# Patient Record
Sex: Female | Born: 1965
Health system: Southern US, Community
[De-identification: ages and names within clinical notes are randomized; demographics above are authoritative.]

## PROBLEM LIST (undated history)

## (undated) HISTORY — PX: ABDOMINAL HYSTERECTOMY: SHX81

---

## 2016-01-10 ENCOUNTER — Emergency Department (HOSPITAL_BASED_OUTPATIENT_CLINIC_OR_DEPARTMENT_OTHER)
Admission: EM | Admit: 2016-01-10 | Discharge: 2016-01-10 | Disposition: A | Discharge status: Home / Self Care | Payer: No Typology Code available for payment source | Attending: Emergency Medicine | Admitting: Emergency Medicine

## 2016-01-10 ENCOUNTER — Encounter (HOSPITAL_BASED_OUTPATIENT_CLINIC_OR_DEPARTMENT_OTHER): Payer: Self-pay | Admitting: Emergency Medicine

## 2016-01-10 DIAGNOSIS — Y9389 Activity, other specified: Secondary | ICD-10-CM | POA: Insufficient documentation

## 2016-01-10 DIAGNOSIS — M542 Cervicalgia: Secondary | ICD-10-CM | POA: Diagnosis present

## 2016-01-10 DIAGNOSIS — Y999 Unspecified external cause status: Secondary | ICD-10-CM | POA: Diagnosis not present

## 2016-01-10 DIAGNOSIS — Y92410 Unspecified street and highway as the place of occurrence of the external cause: Secondary | ICD-10-CM | POA: Insufficient documentation

## 2016-01-10 MED ORDER — METHOCARBAMOL 500 MG PO TABS: 500.0000 mg | ORAL_TABLET | Freq: Two times a day (BID) | ORAL | Status: AC

## 2016-01-10 MED ORDER — NAPROXEN 500 MG PO TABS: 500.0000 mg | ORAL_TABLET | Freq: Two times a day (BID) | ORAL | Status: AC

## 2016-01-10 MED FILL — NAPROXEN 500 MG TABLET: 500 | 15 days supply | Qty: 30 | Fill #0

## 2016-01-10 MED FILL — METHOCARBAMOL 500 MG TABLET: 500 | 10 days supply | Qty: 20 | Fill #0
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
## 2016-01-10 NOTE — Discharge Instructions (Signed)
Take your medications as prescribed as needed for pain relief. I also recommend resting and applying ice to affected areas for 15-20 minutes 3-4 times daily. Starting tomorrow he may apply heat to affected areas as needed for muscle spasm. Please follow up with a primary care provider from the Resource Guide provided below in one week if your symptoms have not improved. Please return to the Emergency Department if symptoms worsen or new onset of fever, numbness, tingling, groin numbness, abdominal pain, vomiting, loss of bowel or bladder, weakness.
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
## 2016-01-16 ENCOUNTER — Other Ambulatory Visit (HOSPITAL_BASED_OUTPATIENT_CLINIC_OR_DEPARTMENT_OTHER): Payer: Self-pay | Admitting: Chiropractic Medicine

## 2016-01-16 ENCOUNTER — Ambulatory Visit (HOSPITAL_BASED_OUTPATIENT_CLINIC_OR_DEPARTMENT_OTHER)
Admission: RE | Admit: 2016-01-16 | Discharge: 2016-01-16 | Disposition: A | Discharge status: Home / Self Care | Payer: No Typology Code available for payment source | Source: Ambulatory Visit | Attending: Chiropractic Medicine | Admitting: Chiropractic Medicine

## 2016-01-16 DIAGNOSIS — M5489 Other dorsalgia: Secondary | ICD-10-CM | POA: Diagnosis present

## 2016-02-03 ENCOUNTER — Emergency Department (HOSPITAL_BASED_OUTPATIENT_CLINIC_OR_DEPARTMENT_OTHER)
Admission: EM | Admit: 2016-02-03 | Discharge: 2016-02-03 | Disposition: A | Discharge status: Home / Self Care | Payer: Self-pay | Attending: Emergency Medicine | Admitting: Emergency Medicine

## 2016-02-03 ENCOUNTER — Encounter (HOSPITAL_BASED_OUTPATIENT_CLINIC_OR_DEPARTMENT_OTHER): Payer: Self-pay | Admitting: Emergency Medicine

## 2016-02-03 DIAGNOSIS — R300 Dysuria: Secondary | ICD-10-CM | POA: Insufficient documentation

## 2016-02-03 DIAGNOSIS — N939 Abnormal uterine and vaginal bleeding, unspecified: Secondary | ICD-10-CM

## 2016-02-03 DIAGNOSIS — N938 Other specified abnormal uterine and vaginal bleeding: Secondary | ICD-10-CM | POA: Insufficient documentation

## 2016-02-03 LAB — WET PREP, GENITAL
CLUE CELLS WET PREP: NONE SEEN
Sperm: NONE SEEN
TRICH WET PREP: NONE SEEN
Yeast Wet Prep HPF POC: NONE SEEN

## 2016-02-03 LAB — URINALYSIS, ROUTINE W REFLEX MICROSCOPIC
BILIRUBIN URINE: NEGATIVE
Glucose, UA: NEGATIVE mg/dL
KETONES UR: NEGATIVE mg/dL
NITRITE: NEGATIVE
PH: 7.5 (ref 5.0–8.0)
Protein, ur: NEGATIVE mg/dL
SPECIFIC GRAVITY, URINE: 1.024 (ref 1.005–1.030)

## 2016-02-03 LAB — URINE MICROSCOPIC-ADD ON

## 2016-02-03 LAB — PREGNANCY, URINE: PREG TEST UR: NEGATIVE

## 2016-02-03 NOTE — ED Provider Notes (Signed)
[facility]-EMERGENCY DEPT Provider Note   CSN: [id number] Arrival date & time: 02/03/16  1705  First Provider Contact:   First MD Initiated Contact with Patient 02/03/16 2037     By signing my name below, I, Emmanuella Mensah, attest that this documentation has been prepared under the direction and in the presence of Yoshi Vicencio, PA-C. Electronically Signed: Angelene GiovanniEmmanuella Mensah, ED Scribe. 02/03/16. 8:45 PM.   History   Chief Complaint Chief Complaint  Patient presents with  . Vaginal Bleeding   HPI Comments: Ashley Bean is a 50 y.o. female who presents to the Emergency Department complaining of ongoing intermittent episodes of vaginal bleeding onset 2 months ago. She reports associated dysuria and foul smelling urine. She notes that she uses a pad during these episodes of vaginal bleeding, but describes the amount as "just spotting." No alleviating factors noted. Pt has not tried any medications PTA. She reports that her LNMP was in April 2017. She denies any fever, chills, vaginal discharge, abdominal pain, n/v/d, or any other complaints.    OB GYN: Dr Okey Duprerawford.   The history is provided by the patient. No language interpreter was used.    History reviewed. No pertinent past medical history.  There are no active problems to display for this patient.   History reviewed. No pertinent surgical history.  OB History    No data available       Home Medications    Prior to Admission medications   Medication Sig Start Date End Date Taking? Authorizing Provider  methocarbamol (ROBAXIN) 500 MG tablet Take 1 tablet (500 mg total) by mouth 2 (two) times daily. 01/10/16   Barrett HenleNicole Elizabeth Nadeau, PA-C  naproxen (NAPROSYN) 500 MG tablet Take 1 tablet (500 mg total) by mouth 2 (two) times daily with a meal. 01/10/16   Barrett HenleNicole Elizabeth Nadeau, PA-C    Family History History reviewed. No pertinent family history.  Social History Social History  Substance Use Topics  . Smoking status: Never  Smoker  . Smokeless tobacco: Never Used  . Alcohol use Not on file     Allergies   Review of patient's allergies indicates no known allergies.   Review of Systems Review of Systems  Constitutional: Negative for chills and fever.  Gastrointestinal: Negative for abdominal pain, nausea and vomiting.  Genitourinary: Positive for dysuria and vaginal bleeding. Negative for flank pain, pelvic pain and vaginal discharge.  All other systems reviewed and are negative.    Physical Exam Updated Vital Signs BP 130/93 (BP Location: Right Arm)   Pulse 76   Temp 98 F (36.7 C) (Oral)   Resp 16   Ht 5\' 5"  (1.651 m)   Wt 175 lb (79.4 kg)   SpO2 98%   BMI 29.12 kg/m   Physical Exam  Constitutional: She appears well-developed and well-nourished. No distress.  HENT:  Head: Normocephalic and atraumatic.  Eyes: Conjunctivae are normal.  Neck: Neck supple.  Cardiovascular: Normal rate, regular rhythm, normal heart sounds and intact distal pulses.   Pulmonary/Chest: Effort normal and breath sounds normal. No respiratory distress.  Abdominal: Soft. There is no tenderness. There is no guarding.  Genitourinary:  Genitourinary Comments: External genitalia normal Vagina with discharge - scant, brownish yellow discharge Cervix  normal negative for cervical motion tenderness Adnexa palpated, no masses or negative for tenderness noted Bladder palpated negative for tenderness Uterus palpated no masses or negative for tenderness Otherwise normal female genitalia. RN, Windell Mouldinguth, served as chaperone during exam.  Musculoskeletal: She exhibits no edema or  tenderness.  Lymphadenopathy:    She has no cervical adenopathy.       Right: No inguinal adenopathy present.       Left: No inguinal adenopathy present.  Neurological: She is alert.  Skin: Skin is warm and dry. She is not diaphoretic.  Psychiatric: She has a normal mood and affect. Her behavior is normal.  Nursing note and vitals  reviewed.    ED Treatments / Results  DIAGNOSTIC STUDIES: Oxygen Saturation is 98% on RA, normal by my interpretation.    COORDINATION OF CARE: 8:43 PM- Pt advised of plan for treatment and pt agrees. Pt will receive pelvic examination. She will also receive lab work for further evaluation.    Labs (all labs ordered are listed, but only abnormal results are displayed) Labs Reviewed  WET PREP, GENITAL - Abnormal; Notable for the following:       Result Value   WBC, Wet Prep HPF POC MANY (*)    All other components within normal limits  URINE CULTURE - Abnormal; Notable for the following:    Culture MULTIPLE SPECIES PRESENT, SUGGEST RECOLLECTION (*)    All other components within normal limits  URINALYSIS, ROUTINE W REFLEX MICROSCOPIC (NOT AT [facility]) - Abnormal; Notable for the following:    APPearance CLOUDY (*)    Hgb urine dipstick MODERATE (*)    Leukocytes, UA MODERATE (*)    All other components within normal limits  URINE MICROSCOPIC-ADD ON - Abnormal; Notable for the following:    Squamous Epithelial / LPF 0-5 (*)    Bacteria, UA FEW (*)    All other components within normal limits  PREGNANCY, URINE  GC/CHLAMYDIA PROBE AMP ([place]) NOT AT [facility]    EKG  EKG Interpretation None       Radiology No results found.  Procedures Procedures (including critical care time)  Medications Ordered in ED Medications - No data to display   Initial Impression / Assessment and Plan / ED Course  Harolyn Rutherford, PA-C has reviewed the triage vital signs and the nursing notes.  Pertinent labs & imaging results that were available during my care of the patient were reviewed by me and considered in my medical decision making (see chart for details).  Clinical Course    Ashley Bean presents with intermittent vaginal bleeding accompanied by occasional dysuria for the past 2 months.  No significant abnormalities found on exam. Urine inconclusive for UTI. Patient advised to  follow-up with OB/GYN. Return precautions discussed. Patient voices understanding of these instructions, accepts the plan, and is comfortable with discharge.  Vitals:   02/03/16 1717 02/03/16 2055  BP: 130/93 140/88  Pulse: 76 74  Resp: 16 18  Temp: 98 F (36.7 C)   TempSrc: Oral   SpO2: 98% 98%  Weight: 79.4 kg   Height:  (1.651 m)      Final Clinical Impressions(s) / ED Diagnoses   Final diagnoses:  Vaginal bleeding    New Prescriptions Discharge Medication List as of 02/03/2016 10:10 PM     I personally performed the services described in this documentation, which was scribed in my presence. The recorded information has been reviewed and is accurate.    Anselm Pancoast, PA-C 02/05/16 1419    Doug Sou, MD 02/05/16 1429

## 2016-02-03 NOTE — ED Triage Notes (Signed)
Pt states she has had vaginal bleeding off and on for about 2 months

## 2016-02-03 NOTE — Discharge Instructions (Signed)
You have been seen today for vaginal bleeding. Your lab tests showed no significant abnormalities. Follow up with OB/GYN as soon as possible. Follow up with PCP as needed.

## 2016-02-04 LAB — GC/CHLAMYDIA PROBE AMP (~~LOC~~) NOT AT ARMC
Chlamydia: NEGATIVE
Neisseria Gonorrhea: NEGATIVE

## 2016-02-05 LAB — URINE CULTURE
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
## 2020-02-13 ENCOUNTER — Encounter (HOSPITAL_BASED_OUTPATIENT_CLINIC_OR_DEPARTMENT_OTHER): Payer: Self-pay | Admitting: *Deleted

## 2020-02-13 ENCOUNTER — Emergency Department (HOSPITAL_BASED_OUTPATIENT_CLINIC_OR_DEPARTMENT_OTHER): Payer: Self-pay

## 2020-02-13 ENCOUNTER — Other Ambulatory Visit: Payer: Self-pay

## 2020-02-13 DIAGNOSIS — R519 Headache, unspecified: Secondary | ICD-10-CM | POA: Insufficient documentation

## 2020-02-13 DIAGNOSIS — R0789 Other chest pain: Secondary | ICD-10-CM | POA: Insufficient documentation

## 2020-02-13 LAB — CBC WITH DIFFERENTIAL/PLATELET
Abs Immature Granulocytes: 0.08 10*3/uL — ABNORMAL HIGH (ref 0.00–0.07)
Basophils Absolute: 0 10*3/uL (ref 0.0–0.1)
Basophils Relative: 0 %
Eosinophils Absolute: 0.1 10*3/uL (ref 0.0–0.5)
Eosinophils Relative: 1 %
HCT: 37.3 % (ref 36.0–46.0)
Hemoglobin: 11.8 g/dL — ABNORMAL LOW (ref 12.0–15.0)
Immature Granulocytes: 1 %
Lymphocytes Relative: 43 %
Lymphs Abs: 3.4 10*3/uL (ref 0.7–4.0)
MCH: 27.8 pg (ref 26.0–34.0)
MCHC: 31.6 g/dL (ref 30.0–36.0)
MCV: 88 fL (ref 80.0–100.0)
Monocytes Absolute: 0.4 10*3/uL (ref 0.1–1.0)
Monocytes Relative: 5 %
Neutro Abs: 3.9 10*3/uL (ref 1.7–7.7)
Neutrophils Relative %: 50 %
Platelets: 270 10*3/uL (ref 150–400)
RBC: 4.24 MIL/uL (ref 3.87–5.11)
RDW: 14 % (ref 11.5–15.5)
WBC: 7.8 10*3/uL (ref 4.0–10.5)
nRBC: 0 % (ref 0.0–0.2)

## 2020-02-13 LAB — COMPREHENSIVE METABOLIC PANEL
ALT: 19 U/L (ref 0–44)
AST: 21 U/L (ref 15–41)
Albumin: 4.4 g/dL (ref 3.5–5.0)
Alkaline Phosphatase: 78 U/L (ref 38–126)
Anion gap: 11 (ref 5–15)
BUN: 13 mg/dL (ref 6–20)
CO2: 25 mmol/L (ref 22–32)
Calcium: 9.8 mg/dL (ref 8.9–10.3)
Chloride: 102 mmol/L (ref 98–111)
Creatinine, Ser: 0.67 mg/dL (ref 0.44–1.00)
GFR calc Af Amer: >60 mL/min (ref >60)
GFR calc non Af Amer: >60 mL/min (ref >60)
Glucose, Bld: 106 mg/dL — ABNORMAL HIGH (ref 70–99)
Potassium: 3.5 mmol/L (ref 3.5–5.1)
Sodium: 138 mmol/L (ref 135–145)
Total Bilirubin: 0.2 mg/dL — ABNORMAL LOW (ref 0.3–1.2)
Total Protein: 8.4 g/dL — ABNORMAL HIGH (ref 6.5–8.1)

## 2020-02-13 LAB — TROPONIN I (HIGH SENSITIVITY): Troponin I (High Sensitivity): 2 ng/L (ref <18)

## 2020-02-13 NOTE — ED Triage Notes (Addendum)
C/o chest pain and h/a  x 2 days , pt slow to answer questions and states she feels" foggy", left pupil larger than right, describes  h/a as generalized  " all over"

## 2020-02-14 ENCOUNTER — Emergency Department (HOSPITAL_BASED_OUTPATIENT_CLINIC_OR_DEPARTMENT_OTHER)
Admission: EM | Admit: 2020-02-14 | Discharge: 2020-02-14 | Disposition: A | Discharge status: Home / Self Care | Payer: Self-pay | Attending: Emergency Medicine | Admitting: Emergency Medicine

## 2020-02-14 DIAGNOSIS — R0789 Other chest pain: Secondary | ICD-10-CM

## 2020-02-14 NOTE — ED Provider Notes (Signed)
[facility] Provider Note: Lowella Dell, MD, FACEP  CSN: [id number] MRN: [id number] ARRIVAL: 02/13/20 at 2120 ROOM: [facility]09/[facility]09   CHIEF COMPLAINT  Chest Pain (h/a)   HISTORY OF PRESENT ILLNESS  02/14/20 3:05 AM Ashley Bean is a 54 y.o. female with chest pain that began yesterday morning about 3 AM.  The pain has been constant and she rates it as "not bad, about 7 out of 10".  It is located across her entire upper chest and she cannot characterize it other than using the word "pain".  There is no associated shortness of breath, diaphoresis, nausea or vomiting.  Nothing makes it better or worse.  She had a headache earlier but that resolved with Advil.   History reviewed. No pertinent past medical history.  Past Surgical History:  Procedure Laterality Date  . ABDOMINAL HYSTERECTOMY      History reviewed. No pertinent family history.  Social History   Tobacco Use  . Smoking status: Never Smoker  . Smokeless tobacco: Never Used  Vaping Use  . Vaping Use: Never used  Substance Use Topics  . Alcohol use: Not on file  . Drug use: No    Prior to Admission medications   Medication Sig Start Date End Date Taking? Authorizing Provider  LORazepam (ATIVAN) 1 MG tablet Take 1 mg by mouth daily as needed. 02/06/20   [provider]  methocarbamol (ROBAXIN) 500 MG tablet Take 1 tablet (500 mg total) by mouth 2 (two) times daily. 01/10/16   Barrett Henle, PA-C  naproxen (NAPROSYN) 500 MG tablet Take 1 tablet (500 mg total) by mouth 2 (two) times daily with a meal. 01/10/16   Barrett Henle, PA-C    Allergies Patient has no known allergies.   REVIEW OF SYSTEMS  Negative except as noted here or in the History of Present Illness.   PHYSICAL EXAMINATION  Initial Vital Signs Blood pressure (!) 142/89, pulse 84, temperature 98.2 F (36.8 C), temperature source Oral, resp. rate 18, height 5\' 4"  (1.626 m), weight 77.1 kg, SpO2 100  %.  Examination General: Well-developed, well-nourished female in no acute distress; appearance consistent with age of record HENT: normocephalic; atraumatic Eyes: pupils round and reactive to light, left pupil slightly larger than the right; extraocular muscles intact Neck: supple Heart: regular rate and rhythm; no murmur Lungs: clear to auscultation bilaterally Chest: Nontender Abdomen: soft; nondistended; nontender; bowel sounds present Extremities: No deformity; full range of motion; pulses normal Neurologic: Awake, alert and oriented; motor function intact in all extremities and symmetric; no facial droop Skin: Warm and dry Psychiatric: Normal mood and affect   RESULTS  Summary of this visit's results, reviewed and interpreted by myself:   Date: 02/13/2020 9:27 PM  Rate: 90  Rhythm: normal sinus rhythm with sinus arrhythmia  QRS Axis: normal  Intervals: normal  ST/T Wave abnormalities: normal  Conduction Disutrbances: none  Narrative Interpretation: unremarkable  Comparison with previous EKG: None available   Laboratory Studies: Results for orders placed or performed during the hospital encounter of 02/14/20 (from the past 24 hour(s))  Troponin I (High Sensitivity)     Status: None   Collection Time: 02/13/20  9:30 PM  Result Value Ref Range   Troponin I (High Sensitivity) 2 <18 ng/L  CBC with Differential     Status: Abnormal   Collection Time: 02/13/20  9:30 PM  Result Value Ref Range   WBC 7.8 4.0 - 10.5 K/uL   RBC 4.24 3.87 - 5.11 MIL/uL  Hemoglobin 11.8 (L) 12.0 - 15.0 g/dL   HCT 59.5 36 - 46 %   MCV 88.0 80.0 - 100.0 fL   MCH 27.8 26.0 - 34.0 pg   MCHC 31.6 30.0 - 36.0 g/dL   RDW 63.8 75.6 - 43.3 %   Platelets 270 150 - 400 K/uL   nRBC 0.0 0.0 - 0.2 %   Neutrophils Relative % 50 %   Neutro Abs 3.9 1.7 - 7.7 K/uL   Lymphocytes Relative 43 %   Lymphs Abs 3.4 0.7 - 4.0 K/uL   Monocytes Relative 5 %   Monocytes Absolute 0.4 0 - 1 K/uL   Eosinophils  Relative 1 %   Eosinophils Absolute 0.1 0 - 0 K/uL   Basophils Relative 0 %   Basophils Absolute 0.0 0 - 0 K/uL   Immature Granulocytes 1 %   Abs Immature Granulocytes 0.08 (H) 0.00 - 0.07 K/uL  Comprehensive metabolic panel     Status: Abnormal   Collection Time: 02/13/20  9:30 PM  Result Value Ref Range   Sodium 138 135 - 145 mmol/L   Potassium 3.5 3.5 - 5.1 mmol/L   Chloride 102 98 - 111 mmol/L   CO2 25 22 - 32 mmol/L   Glucose, Bld 106 (H) 70 - 99 mg/dL   BUN 13 6 - 20 mg/dL   Creatinine, Ser 2.95 0.44 - 1.00 mg/dL   Calcium 9.8 8.9 - 18.8 mg/dL   Total Protein 8.4 (H) 6.5 - 8.1 g/dL   Albumin 4.4 3.5 - 5.0 g/dL   AST 21 15 - 41 U/L   ALT 19 0 - 44 U/L   Alkaline Phosphatase 78 38 - 126 U/L   Total Bilirubin 0.2 (L) 0.3 - 1.2 mg/dL   GFR calc non Af Amer >60 >60 mL/min   GFR calc Af Amer >60 >60 mL/min   Anion gap 11 5 - 15   Imaging Studies: DG Chest 2 View  Result Date: 02/13/2020 CLINICAL DATA:  Chest pain. Additional provided: Patient reports chest pain and headache for 2 days. EXAM: CHEST - 2 VIEW COMPARISON:  Prior chest radiograph 12/06/2016 and earlier FINDINGS: Heart size within normal limits. No appreciable airspace consolidation or pulmonary edema. No evidence of pleural effusion or pneumothorax. No acute bony abnormality identified. Surgical clips within the right upper quadrant of the abdomen. IMPRESSION: No evidence of active cardiopulmonary disease. Electronically Signed   By: Jackey Loge DO   On: 02/13/2020 21:51   CT Head Wo Contrast  Result Date: 02/13/2020 CLINICAL DATA:  Headache.  Anisocoria. EXAM: CT HEAD WITHOUT CONTRAST TECHNIQUE: Contiguous axial images were obtained from the base of the skull through the vertex without intravenous contrast. COMPARISON:  None. FINDINGS: Brain: There is no mass, hemorrhage or extra-axial collection. The size and configuration of the ventricles and extra-axial CSF spaces are normal. The brain parenchyma is normal, without  acute or chronic infarction. Vascular: No abnormal hyperdensity of the major intracranial arteries or dural venous sinuses. No intracranial atherosclerosis. Skull: The visualized skull base, calvarium and extracranial soft tissues are normal. Sinuses/Orbits: No fluid levels or advanced mucosal thickening of the visualized paranasal sinuses. No mastoid or middle ear effusion. The orbits are normal. IMPRESSION: Normal head CT. Electronically Signed   By: Deatra Robinson M.D.   On: 02/13/2020 22:04    ED COURSE and MDM  Nursing notes, initial and subsequent vitals signs, including pulse oximetry, reviewed and interpreted by myself.  Vitals:   02/13/20 2126 02/13/20 2130  02/14/20 0256  BP:  (!) 166/103 (!) 142/89  Pulse:  68 84  Resp:  18 18  Temp:   98.2 F (36.8 C)  TempSrc:   Oral  SpO2:  99% 100%  Weight: 77.1 kg    Height: 5\' 4"  (1.626 m)     Medications - No data to display  The patient has a HEART score of 1.  Her chest pain has been constant and is without concerning symptoms such as shortness of breath, diaphoresis or exacerbation with exertion.  I believe she is stable for discharge.   PROCEDURES  Procedures   ED DIAGNOSES     ICD-10-CM   1. Atypical chest pain  R07.89        09-06-1989, MD 02/14/20 [id number]
